# Patient Record
Sex: Female | Born: 1995 | Race: Black or African American | Hispanic: No | Marital: Single | Smoking: Never smoker
Health system: Southern US, Community
[De-identification: ages and names within clinical notes are randomized; demographics above are authoritative.]

---
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
## 2015-09-17 ENCOUNTER — Encounter: Payer: Self-pay | Admitting: Emergency Medicine

## 2015-09-17 ENCOUNTER — Ambulatory Visit
Admission: EM | Admit: 2015-09-17 | Discharge: 2015-09-17 | Disposition: A | Discharge status: Home / Self Care | Payer: PRIVATE HEALTH INSURANCE | Attending: Emergency Medicine | Admitting: Emergency Medicine

## 2015-09-17 DIAGNOSIS — L509 Urticaria, unspecified: Secondary | ICD-10-CM

## 2015-09-17 MED ORDER — MUPIROCIN 2 % EX OINT: 1.0000 "application " | TOPICAL_OINTMENT | Freq: Three times a day (TID) | CUTANEOUS | 0 refills | Status: AC

## 2015-09-17 MED ORDER — PREDNISONE 10 MG (21) PO TBPK: ORAL_TABLET | ORAL | 0 refills | Status: AC

## 2015-09-17 NOTE — ED Provider Notes (Signed)
HPI  SUBJECTIVE:  Lindsay Hendrix is a 20 y.o. female who presents with itchy "bug bites" on her extremities starting 3 days ago. States it started on front of her legs, but now has moved to the side of her leg. She states that these have not migrated since. She reports some nausea last night, but no vomiting. No fevers. She does not recall any insect bites, but she was outside and became symptomatic. She states that she "gets bitten a lot". She reports achy intermittent seconds long pain in certain areas. No angioedema, voice changes, shortness of breath, wheezing, difficulty swallowing, difficulty breathing. No antibiotics in the past month. No change in medications, she does not take any medicines other than OCPs on a regular basis. No new lotions, soaps, detergents, foods. She tried witch hazel and unknown antiseptic and cocoa butter without improvement. There are no other aggravating or alleviating factors. She has not tried any antihistamines for this. She has a past medical history of occasional allergies to pollen. No history of diabetes, hypertension, 8P, eczema, asthma, anaphylaxis, urticaria. LMP: 7/14, denies possibility of being pregnant. PMD: [facility]    History reviewed. No pertinent past medical history.  History reviewed. No pertinent surgical history.  History reviewed. No pertinent family history.  Social History  Substance Use Topics  . Smoking status: Never Smoker  . Smokeless tobacco: Never Used  . Alcohol use Yes    No current facility-administered medications for this encounter.   Current Outpatient Prescriptions:  .  Norgestimate-Ethinyl Estradiol Triphasic (TRI-SPRINTEC) 0.18/0.215/0.25 MG-35 MCG tablet, Take 1 tablet by mouth daily., Disp: , Rfl:  .  mupirocin ointment (BACTROBAN) 2 %, Apply 1 application topically 3 (three) times daily., Disp: 22 g, Rfl: 0 .  predniSONE (STERAPRED UNI-PAK 21 TAB) 10 MG (21) TBPK tablet, Dispense one 6 day pack. Take as  directed with food., Disp: 21 tablet, Rfl: 0  No Known Allergies   ROS  As noted in HPI.   Physical Exam  BP 116/66 (BP Location: Left Arm)   Pulse 78   Temp 97.8 F (36.6 C) (Oral)   Resp 16   Ht  (1.702 m)   Wt 148 lb (67.1 kg)   LMP 08/29/2015 (Exact Date)   SpO2 99%   BMI 23.18 kg/m   Constitutional: Well developed, well nourished, no acute distress Eyes:  EOMI, conjunctiva normal bilaterally HENT: Normocephalic, atraumatic,mucus membranes moistNo angioedema. Airway widely patent. Voice normal  Respiratory: Normal inspiratory effort Cardiovascular: Normal rate GI: nondistended skin: Nontender, erythematous urticaria over lateral upper left thigh, calf, left middle finger, right upper thigh. See picture.    Musculoskeletal: no deformities Neurologic: Alert & oriented x 3, no focal neuro deficits Psychiatric: Speech and behavior appropriate   ED Course   Medications - No data to display  No orders of the defined types were placed in this encounter.   No results found for this or any previous visit (from the past 24 hour(s)). No results found.  ED Clinical Impression  Urticaria   ED Assessment/Plan  Presentation most consistent with urticaria although this may be an exaggerated allergic reaction to insect bites. It does not appear to be any signs of infection at this time, although given the amount of patient's scratching, they may become secondarily infected, so sending home with Bactroban. Patient follow-up with primary care physician as needed. Discussed MDM, plan and followup with patient . Discussed sn/sx that should prompt return to the ED. Patient  agrees with plan.   *  This clinic note was created using Scientist, clinical (histocompatibility and immunogenetics). Therefore, there may be occasional mistakes despite careful proofreading.  ?   Domenick Gong, MD 09/17/15 1105

## 2015-09-17 NOTE — Discharge Instructions (Signed)
Start taking 10 mg of Zyrtec along with the prednisone. Apply the Bactroban or bacitracin to help prevent secondary infection.

## 2015-09-17 NOTE — ED Triage Notes (Signed)
Patient c/o several mosquito bites on her legs since Monday.
